# Patient Record
Sex: Female | Born: 1945 | Race: White | Hispanic: No | Smoking: Never smoker
Health system: Southern US, Community
[De-identification: ages and names within clinical notes are randomized; demographics above are authoritative.]

## PROBLEM LIST (undated history)

## (undated) DIAGNOSIS — N6019 Diffuse cystic mastopathy of unspecified breast: Secondary | ICD-10-CM

## (undated) DIAGNOSIS — F0280 Dementia in other diseases classified elsewhere without behavioral disturbance: Secondary | ICD-10-CM

## (undated) DIAGNOSIS — I10 Essential (primary) hypertension: Secondary | ICD-10-CM

## (undated) DIAGNOSIS — G309 Alzheimer's disease, unspecified: Secondary | ICD-10-CM

## (undated) DIAGNOSIS — F0390 Unspecified dementia without behavioral disturbance: Secondary | ICD-10-CM

## (undated) HISTORY — DX: Unspecified dementia, unspecified severity, without behavioral disturbance, psychotic disturbance, mood disturbance, and anxiety: F03.90

## (undated) HISTORY — DX: Essential (primary) hypertension: I10

## (undated) HISTORY — PX: TUBAL LIGATION: SHX77

## (undated) HISTORY — PX: NASAL SINUS SURGERY: SHX719

## (undated) HISTORY — DX: Dementia in other diseases classified elsewhere, unspecified severity, without behavioral disturbance, psychotic disturbance, mood disturbance, and anxiety: F02.80

## (undated) HISTORY — PX: DILATION AND CURETTAGE OF UTERUS: SHX78

## (undated) HISTORY — DX: Diffuse cystic mastopathy of unspecified breast: N60.19

## (undated) HISTORY — DX: Alzheimer's disease, unspecified: G30.9

## (undated) HISTORY — PX: TONSILLECTOMY AND ADENOIDECTOMY: SUR1326

## (undated) HISTORY — PX: COLONOSCOPY: SHX174

---

## 2004-08-03 ENCOUNTER — Ambulatory Visit: Payer: Self-pay | Admitting: General Surgery

## 2004-08-15 ENCOUNTER — Ambulatory Visit: Payer: Self-pay | Admitting: General Surgery

## 2005-09-04 ENCOUNTER — Ambulatory Visit: Payer: Self-pay | Admitting: General Surgery

## 2006-09-20 ENCOUNTER — Ambulatory Visit: Payer: Self-pay | Admitting: Gastroenterology

## 2006-10-07 ENCOUNTER — Ambulatory Visit: Payer: Self-pay | Admitting: Gastroenterology

## 2006-10-22 ENCOUNTER — Ambulatory Visit: Payer: Self-pay | Admitting: General Surgery

## 2007-02-04 ENCOUNTER — Ambulatory Visit: Payer: Self-pay | Admitting: Internal Medicine

## 2007-12-04 ENCOUNTER — Ambulatory Visit: Payer: Self-pay | Admitting: Internal Medicine

## 2008-12-06 ENCOUNTER — Ambulatory Visit: Payer: Self-pay | Admitting: General Surgery

## 2009-12-14 ENCOUNTER — Ambulatory Visit: Payer: Self-pay

## 2010-12-20 ENCOUNTER — Ambulatory Visit: Payer: Self-pay | Admitting: General Surgery

## 2011-12-24 ENCOUNTER — Ambulatory Visit: Payer: Self-pay | Admitting: General Surgery

## 2012-06-25 ENCOUNTER — Encounter: Payer: Self-pay | Admitting: *Deleted

## 2013-01-05 ENCOUNTER — Ambulatory Visit: Payer: Self-pay | Admitting: General Surgery

## 2013-03-20 ENCOUNTER — Observation Stay: Payer: Self-pay | Admitting: Internal Medicine

## 2013-03-20 LAB — CBC
HCT: 39.5 % (ref 35.0–47.0)
HGB: 13 g/dL (ref 12.0–16.0)
MCH: 30.2 pg (ref 26.0–34.0)
MCHC: 33 g/dL (ref 32.0–36.0)
MCV: 92 fL (ref 80–100)
Platelet: 172 10*3/uL (ref 150–440)
RBC: 4.31 10*6/uL (ref 3.80–5.20)
RDW: 13.2 % (ref 11.5–14.5)
WBC: 11.9 10*3/uL — AB (ref 3.6–11.0)

## 2013-03-20 LAB — URINALYSIS, COMPLETE
Bilirubin,UR: NEGATIVE
Blood: NEGATIVE
GLUCOSE, UR: NEGATIVE mg/dL (ref 0–75)
Hyaline Cast: 5
Ketone: NEGATIVE
Nitrite: NEGATIVE
PH: 8 (ref 4.5–8.0)
Protein: 30
RBC,UR: 3 /HPF (ref 0–5)
SPECIFIC GRAVITY: 1.013 (ref 1.003–1.030)
Squamous Epithelial: 1
WBC UR: 97 /HPF (ref 0–5)

## 2013-03-20 LAB — COMPREHENSIVE METABOLIC PANEL
ANION GAP: 5 — AB (ref 7–16)
Albumin: 3.9 g/dL (ref 3.4–5.0)
Alkaline Phosphatase: 43 U/L — ABNORMAL LOW
BILIRUBIN TOTAL: 0.3 mg/dL (ref 0.2–1.0)
BUN: 14 mg/dL (ref 7–18)
Calcium, Total: 9.1 mg/dL (ref 8.5–10.1)
Chloride: 105 mmol/L (ref 98–107)
Co2: 29 mmol/L (ref 21–32)
Creatinine: 1.39 mg/dL — ABNORMAL HIGH (ref 0.60–1.30)
EGFR (African American): 45 — ABNORMAL LOW
EGFR (Non-African Amer.): 39 — ABNORMAL LOW
Glucose: 121 mg/dL — ABNORMAL HIGH (ref 65–99)
Osmolality: 279 (ref 275–301)
Potassium: 3.8 mmol/L (ref 3.5–5.1)
SGOT(AST): 27 U/L (ref 15–37)
SGPT (ALT): 29 U/L (ref 12–78)
Sodium: 139 mmol/L (ref 136–145)
Total Protein: 6.8 g/dL (ref 6.4–8.2)

## 2013-03-20 LAB — TROPONIN I: Troponin-I: <0.02 ng/mL

## 2013-03-22 LAB — CBC WITH DIFFERENTIAL/PLATELET
BASOS ABS: 0.1 10*3/uL (ref 0.0–0.1)
BASOS PCT: 0.9 %
Eosinophil #: 0.2 10*3/uL (ref 0.0–0.7)
Eosinophil %: 3.7 %
HCT: 35.8 % (ref 35.0–47.0)
HGB: 12 g/dL (ref 12.0–16.0)
LYMPHS ABS: 1.8 10*3/uL (ref 1.0–3.6)
LYMPHS PCT: 26.3 %
MCH: 30.7 pg (ref 26.0–34.0)
MCHC: 33.6 g/dL (ref 32.0–36.0)
MCV: 91 fL (ref 80–100)
Monocyte #: 0.6 x10 3/mm (ref 0.2–0.9)
Monocyte %: 9.1 %
Neutrophil #: 4 10*3/uL (ref 1.4–6.5)
Neutrophil %: 60 %
PLATELETS: 181 10*3/uL (ref 150–440)
RBC: 3.93 10*6/uL (ref 3.80–5.20)
RDW: 12.9 % (ref 11.5–14.5)
WBC: 6.7 10*3/uL (ref 3.6–11.0)

## 2013-03-22 LAB — BASIC METABOLIC PANEL
Anion Gap: 5 — ABNORMAL LOW (ref 7–16)
BUN: 9 mg/dL (ref 7–18)
CREATININE: 1.31 mg/dL — AB (ref 0.60–1.30)
Calcium, Total: 8.9 mg/dL (ref 8.5–10.1)
Chloride: 104 mmol/L (ref 98–107)
Co2: 30 mmol/L (ref 21–32)
EGFR (Non-African Amer.): 42 — ABNORMAL LOW
GFR CALC AF AMER: 49 — AB
Glucose: 101 mg/dL — ABNORMAL HIGH (ref 65–99)
OSMOLALITY: 276 (ref 275–301)
POTASSIUM: 2.9 mmol/L — AB (ref 3.5–5.1)
Sodium: 139 mmol/L (ref 136–145)

## 2013-03-22 LAB — URINE CULTURE

## 2013-03-23 ENCOUNTER — Ambulatory Visit: Payer: Self-pay | Admitting: Internal Medicine

## 2013-03-23 LAB — BASIC METABOLIC PANEL
Anion Gap: 4 — ABNORMAL LOW (ref 7–16)
BUN: 12 mg/dL (ref 7–18)
CALCIUM: 8.9 mg/dL (ref 8.5–10.1)
CHLORIDE: 105 mmol/L (ref 98–107)
Co2: 30 mmol/L (ref 21–32)
Creatinine: 1.28 mg/dL (ref 0.60–1.30)
EGFR (Non-African Amer.): 43 — ABNORMAL LOW
GFR CALC AF AMER: 50 — AB
Glucose: 88 mg/dL (ref 65–99)
OSMOLALITY: 277 (ref 275–301)
Potassium: 3.1 mmol/L — ABNORMAL LOW (ref 3.5–5.1)
SODIUM: 139 mmol/L (ref 136–145)

## 2013-03-23 LAB — CBC WITH DIFFERENTIAL/PLATELET
BASOS PCT: 1.2 %
Basophil #: 0.1 10*3/uL (ref 0.0–0.1)
EOS PCT: 4.4 %
Eosinophil #: 0.3 10*3/uL (ref 0.0–0.7)
HCT: 37.3 % (ref 35.0–47.0)
HGB: 12.3 g/dL (ref 12.0–16.0)
LYMPHS ABS: 1.7 10*3/uL (ref 1.0–3.6)
Lymphocyte %: 23.9 %
MCH: 30 pg (ref 26.0–34.0)
MCHC: 32.9 g/dL (ref 32.0–36.0)
MCV: 91 fL (ref 80–100)
MONOS PCT: 9.8 %
Monocyte #: 0.7 x10 3/mm (ref 0.2–0.9)
NEUTROS ABS: 4.3 10*3/uL (ref 1.4–6.5)
Neutrophil %: 60.7 %
PLATELETS: 187 10*3/uL (ref 150–440)
RBC: 4.1 10*6/uL (ref 3.80–5.20)
RDW: 12.7 % (ref 11.5–14.5)
WBC: 7.2 10*3/uL (ref 3.6–11.0)

## 2013-11-02 ENCOUNTER — Encounter: Payer: Self-pay | Admitting: *Deleted

## 2014-03-28 ENCOUNTER — Emergency Department: Payer: Self-pay | Admitting: Internal Medicine

## 2014-03-28 LAB — CBC
HCT: 36 % (ref 35.0–47.0)
HGB: 11.6 g/dL — AB (ref 12.0–16.0)
MCH: 29.6 pg (ref 26.0–34.0)
MCHC: 32.2 g/dL (ref 32.0–36.0)
MCV: 92 fL (ref 80–100)
Platelet: 176 10*3/uL (ref 150–440)
RBC: 3.92 10*6/uL (ref 3.80–5.20)
RDW: 12.2 % (ref 11.5–14.5)
WBC: 13.4 10*3/uL — ABNORMAL HIGH (ref 3.6–11.0)

## 2014-03-28 LAB — COMPREHENSIVE METABOLIC PANEL
ALK PHOS: 38 U/L
AST: 20 U/L
Albumin: 4 g/dL
Anion Gap: 5 — ABNORMAL LOW (ref 7–16)
BILIRUBIN TOTAL: 0.6 mg/dL
BUN: 23 mg/dL — AB
CREATININE: 1.37 mg/dL — AB
Calcium, Total: 9 mg/dL
Chloride: 104 mmol/L
Co2: 31 mmol/L
EGFR (African American): 46 — ABNORMAL LOW
EGFR (Non-African Amer.): 40 — ABNORMAL LOW
GLUCOSE: 108 mg/dL — AB
Potassium: 3.7 mmol/L
SGPT (ALT): 18 U/L
Sodium: 140 mmol/L
TOTAL PROTEIN: 6.7 g/dL

## 2014-03-28 LAB — TROPONIN I: Troponin-I: 0.03 ng/mL

## 2014-03-28 LAB — URINALYSIS, COMPLETE
BILIRUBIN, UR: NEGATIVE
BLOOD: NEGATIVE
Bacteria: NONE SEEN
Glucose,UR: NEGATIVE mg/dL (ref 0–75)
Ketone: NEGATIVE
Nitrite: NEGATIVE
PH: 6 (ref 4.5–8.0)
RBC,UR: NONE SEEN /HPF (ref 0–5)
Specific Gravity: 1.011 (ref 1.003–1.030)
Squamous Epithelial: 2
WBC UR: 4 /HPF (ref 0–5)

## 2014-03-30 LAB — URINE CULTURE

## 2014-04-24 NOTE — H&P (Signed)
PATIENT NAME:  Dante GangLANE, Demiana F MR#:  [id number] DATE OF BIRTH:  Oct 25, 1945  DATE OF ADMISSION:  03/20/2013  PRIMARY CARE PHYSICIAN: Dr. Amado NashJ.B. Lourdes SledgeWalker, [facility].   CHIEF COMPLAINT: Syncopal spell today and a fall yesterday.   HISTORY OF PRESENT ILLNESS: Ms. Maurice MarchLane is a pleasant 69 year old Caucasian female with past medical history of advanced dementia, who is totally dependent for care, who is cared upon totally by her husband. The patient's dementia per family started in 2009, however, in the last year or year and a half is progressing very rapidly to the point patient is dependent on her husband for everything. All her care is given by her husband and aide who helps him out a few times a week. Per husband,  the patient was sitting at the breakfast bar yesterday. She tried to get up, but the patient fell down on the floor and has a bruise on her left eyebrow. There was minimal bleeding noted at that time. The patient did well all day yesterday; however, this morning she was in the shower, being helped by a caregiver. She felt very flushed, diaphoretic, and sat down on the side of the bathtub. She then sat on the commode and had a large BM per husband and thereafter felt very lightheaded and had a syncopal episode, where daughter came in to help out the caregiver. They put the patient down on the floor after they spoke with EMS to make sure she has good respirations. Thereafter, she was brought into the Emergency Room and was found to have blood pressure of 94/63 with pulse of 60. The patient is nonverbal, talks very little. She does have some tremors on and off. In the Emergency Room, she received IV fluids. Her blood pressure is stable at present, 134/70. She was found to have UTI. She is going to be started on IV Cipro. CT of the head is unremarkable. She remains in sinus rhythm. She is going to be admitted for further evaluation and management.   ALLERGIES: No known drug allergies.    MEDICATIONS: 1.  Simvastatin 40 mg daily.  2.  Risperidone 2 mg tablet, 1-1/2 tablets (that is 3 mg) once a day, which the 1/2 tablet was increased by husband recently which helps her relax, and 2 mg at bedtime.  3.  Losartan 100 mg daily.  4.  Aspirin 81 mg daily.   PAST MEDICAL HISTORY: 1.  Hypertension.  2.  CKD, stage II. Creatinine baseline around 1.3 to 1.4.  3.  Hyperlipidemia.  4.  Dementia with vision and memory loss, progressively getting worse.  5.  Obesity. 6.  Alzheimer's dementia since 2009.   PAST SURGICAL HISTORY:  1.  Bilateral carpal tunnel repair in 1997.  2.  D and C. 3.  Tubal ligation.   FAMILY HISTORY: Positive for breast cancer. This is obtained from old records. Positive for hypertension, diabetes, and heart disease.   SOCIAL HISTORY: Lives with her husband, who gives her 24/7 care. The patient does not smoke or drink.   REVIEW OF SYSTEMS: Unable to obtain since the patient has advanced dementia. She is not able to answer most of my questions. However, most of it is covered in present illness as related by the family.   PHYSICAL EXAMINATION: GENERAL: The patient is awake. She keeps her eyes closed. She opens eyes on verbal command.  VITAL SIGNS: Afebrile. Pulse is 74. Blood pressure is 151/71. Sats are 95% on room air.  HEENT: The patient has mild  bruise over the left eyebrow. No bleeding. Rest atraumatic. Pupils: PERRLA. EOM intact. Oral mucosa is moist.  NECK: Supple. No JVD. No carotid bruit.  LUNGS: Clear to auscultation bilaterally. No rales, rhonchi, respiratory distress or labored breathing.  HEART: Both the heart sounds are normal. Rate, rhythm regular. PMI not lateralized. Chest is nontender.  EXTREMITIES: Good pedal pulses. Good femoral pulses. No lower extremity edema.  ABDOMEN: Soft, benign, nontender. No organomegaly. Positive bowel sounds.  NEUROLOGIC: Gross intact cranial nerves II through XII. No motor or sensory deficit. Neuro exam again  limited secondary to patient's advanced dementia, is grossly nonfocal. Gait not tested.  SKIN: Warm and dry.  PSYCHIATRIC: The patient is awake. She is alert. However, she is nonverbal, likely secondary to progressive dementia.   LABORATORY, DIAGNOSTIC, AND RADIOLOGICAL DATA: EKG shows normal sinus rhythm, nonspecific ST-T abnormality. Urinalysis positive for UTI. Troponin less than 0.02. White count is 11.9; hemoglobin and hematocrit are 13 and 39.5; platelet count is 172. Glucose is 121; BUN is 14, creatinine 1.39; sodium is 139; potassium is 3.8. LFTs within normal limits. CT of the head: No acute intracranial abnormality. Chest x-ray: No active cardiopulmonary disease.   ASSESSMENT: A 69 year old Jax Abdelrahman with history of advanced progressive dementia, hypertension, and hyperlipidemia, comes in with:  1.  Syncopal episode today with a fall yesterday: Etiology unclear. Could be secondary to vasovagal symptoms after patient had a large bowel movement today while she was on the commode in the bathroom and precipitated by urinary tract infection, which patient was found to have on urinalysis. The patient was hypotensive initially in the Emergency Room, received IV fluids. Blood pressure is now stable. We will admit her to off-unit telemetry. Give IV fluids, another liter, and start her on IV Cipro 400 b.i.d. for urinary tract infection. Will monitor blood pressure and tele rhythm.  2.  Urinary tract infection: Continue IV Cipro. Follow up urine cultures. Change to p.o. Cipro at discharge.  3.  History of hypertension with relative hypotension on arrival: Will hold off on losartan for now. Monitor blood pressure. If it continues to remain stable, resume it.  4.  Hyperlipidemia: On simvastatin.  5.  Advanced progressive dementia: Continue Risperdal.  6.  I discussed the admission plan with the patient's daughter and husband, who were present in the Emergency Room. Discussed about the role of physical  therapy; however, since the patient has advanced progressive dementia, not sure how much she will be following up with physical therapy instructions, hence, will hold off on physical therapy, which family is agreeable. Family also does not wish to pursue any further work-up with MRI or any other aggressive testing at this time. Code status discussed. The patient is a full code.   TIME SPENT: 55 minutes.    ____________________________ Wylie Hail Allena Katz, MD sap:jcm D: 03/20/2013 16:08:02 ET T: 03/20/2013 18:18:01 ET JOB#: [id number]  cc: Dulse Rutan A. Allena Katz, MD, <Dictator> John B. Danne Harbor, MD Willow Ora MD ELECTRONICALLY SIGNED 04/09/2013 16:21

## 2014-04-24 NOTE — Discharge Summary (Signed)
PATIENT NAME:  Susan Davenport, Susan Davenport MR#:  [id number] DATE OF BIRTH:  May 16, 1945  DATE OF ADMISSION:  03/20/2013 DATE OF DISCHARGE:  03/24/2013  HISTORY OF PRESENT ILLNESS: Susan Davenport is a 69 year old white lady with a history of end-stage dementia who was brought to the Emergency Room after she fell to the floor and could not get back up. The patient is essentially a total care patient who had stayed at home by being cared for by her husband. In the Emergency Room, she was flushed and diaphoretic, and had had a syncopal episode and was brought to the ER.   ALLERGIES: No known drug allergies.   MEDICATIONS: Prior to admission include: Simvastatin 40 mg daily, risperidone 2 mg tablets 1-1/2 tablets once a day in the morning and 2 mg at bedtime, losartan 100 mg daily and aspirin 81 mg daily.   PAST MEDICAL HISTORY:  Notable for hypertension, stage 2 chronic kidney disease, hyperlipidemia, severe dementia and obesity.   ADMISSION PHYSICAL EXAMINATION: Revealed pulse 74, blood pressure 151/71 and a pulse ox of 95% on room air. The patient was noted to have a small bruise over her left eyebrow. There was no bleeding. The remainder of the examination was relatively unremarkable. Neurological could not be ascertained due to the patient's severe dementia. She was noted to be alert but nonverbal.   LABORATORY, DIAGNOSTIC AND RADIOLOGICAL DATA:  EKG showed a normal sinus rhythm with nonspecific ST-T wave changes. Urinalysis showed positive sediment. Troponin was 0.02. White count was 11,900, hemoglobin and hematocrit were 13 and 39.5 respectively, platelet count 172,000. Glucose was 121, BUN was 14, creatinine was 1.39, sodium was 139, potassium was 3.8. Liver function studies were within normal limits. Unenhanced T of the head done in the ER showed no acute changes. Chest x-ray showed no acute cardiopulmonary disease.   HOSPITAL COURSE: The patient was admitted to the regular medical floor where she was rehydrated  with IV fluids as she was initially hypotensive in the Emergency Room. Cultures were obtained. The patient was started on some IV Cipro for suspected urinary tract infection. The patient's urinalysis eventually grew out greater than 100,000 colonies of Proteus that was sensitive to Cipro. The patient was eventually converted to oral Cipro. She was seen by PT but was not felt to be a candidate for rehab. She was then seen by palliative care to arrange for her to go home with  hospice care.   DISCHARGE DIAGNOSES: 1.  Metabolic encephalopathy due to Proteus urinary tract infection.  2.  End-stage dementia of Alzheimer's type.  3.  History of hypertension.   DISCHARGE MEDICATIONS: 1.  Aspirin 81 mg daily.  2.  Losartan 100 mg daily.  3.  Risperdal 3 mg b.i.d.  4.  Zoloft 100 mg daily.  5.  Cipro 500 mg b.i.d. for an additional 5 days.  6.  The patient was going to do discontinue her simvastatin and potassium chloride.   DISCHARGE INSTRUCTIONS:  The patient was discharged on a regular diet with activity as tolerated. As noted, she is to be followed by hospice at home. The patient already had an appointment for April which she is to keep in followup.   ____________________________ Letta PateJohn B. Danne HarborWalker III, MD jbw:cs D: 04/07/2013 19:42:44 ET T: 04/07/2013 20:06:20 ET JOB#: [id number]  cc: Letta PateJohn B. Danne HarborWalker III, MD, <Dictator> Elmo PuttJOHN B WALKER III MD ELECTRONICALLY SIGNED 04/09/2013 7:29

## 2014-04-24 NOTE — Consult Note (Signed)
   Comments   I spoke with pt's husband and daughters. They have discussed her code status and have made a decision for DNR. Order entered. Will complete a portable form.   Electronic Signatures for Addendum Section:  Phifer, Harriett SineNancy (MD) (Signed Addendum 24-Mar-15 14:20)  Discussed with Elouise MunroeJosh Borders, NP, in detail. Agree with assessment and plan as outlined in above note. Familyl has decided on DNR. Out-of-facility DNR completed and placed on chart.   Electronic Signatures: Borders, Daryl EasternJoshua R (NP)  (Signed 24-Mar-15 10:57)  Authored: Palliative Care   Last Updated: 24-Mar-15 14:20 by Phifer, Harriett SineNancy (MD)

## 2015-12-28 ENCOUNTER — Encounter: Payer: Self-pay | Admitting: Emergency Medicine

## 2015-12-28 ENCOUNTER — Emergency Department: Payer: Medicare Other

## 2015-12-28 ENCOUNTER — Emergency Department
Admission: EM | Admit: 2015-12-28 | Discharge: 2015-12-28 | Disposition: A | Discharge status: Home / Self Care | Payer: Medicare Other | Attending: Emergency Medicine | Admitting: Emergency Medicine

## 2015-12-28 DIAGNOSIS — I10 Essential (primary) hypertension: Secondary | ICD-10-CM | POA: Diagnosis not present

## 2015-12-28 DIAGNOSIS — Y999 Unspecified external cause status: Secondary | ICD-10-CM | POA: Insufficient documentation

## 2015-12-28 DIAGNOSIS — S0101XA Laceration without foreign body of scalp, initial encounter: Secondary | ICD-10-CM

## 2015-12-28 DIAGNOSIS — W050XXA Fall from non-moving wheelchair, initial encounter: Secondary | ICD-10-CM | POA: Diagnosis not present

## 2015-12-28 DIAGNOSIS — Y929 Unspecified place or not applicable: Secondary | ICD-10-CM | POA: Diagnosis not present

## 2015-12-28 DIAGNOSIS — Z79899 Other long term (current) drug therapy: Secondary | ICD-10-CM | POA: Diagnosis not present

## 2015-12-28 DIAGNOSIS — G309 Alzheimer's disease, unspecified: Secondary | ICD-10-CM | POA: Diagnosis not present

## 2015-12-28 DIAGNOSIS — Y939 Activity, unspecified: Secondary | ICD-10-CM | POA: Diagnosis not present

## 2015-12-28 DIAGNOSIS — S0990XA Unspecified injury of head, initial encounter: Secondary | ICD-10-CM

## 2015-12-28 NOTE — ED Provider Notes (Signed)
[facility] Emergency Department Provider Note    L5 caveat: Review of systems and history is limited by dementia    Time seen: ----------------------------------------- 10:21 PM on 12/28/2015 -----------------------------------------    I have reviewed the triage vital signs and the nursing notes.   HISTORY  Chief Complaint Fall and Laceration    HPI Susan Davenport is a 70 y.o. female who presents to ER after a fall with a forehead laceration. Patient fell out of her wheelchair tonight causing her to hit her head on the floor. She is currently wheelchair-bound and has severe dementia. She was noted to have a laceration to her forehead. She denied any complaint to her husband.   Past Medical History:  Diagnosis Date  . Alzheimer's disease   . Dementia   . Diffuse cystic mastopathy   . Hypertension     There are no active problems to display for this patient.   Past Surgical History:  Procedure Laterality Date  . COLONOSCOPY    . DILATION AND CURETTAGE OF UTERUS    . NASAL SINUS SURGERY    . TONSILLECTOMY AND ADENOIDECTOMY    . TUBAL LIGATION      Allergies Patient has no known allergies.  Social History Social History  Substance Use Topics  . Smoking status: Never Smoker  . Smokeless tobacco: Never Used  . Alcohol use No    Review of Systems Unknown, positive for laceration and head injury  ____________________________________________   PHYSICAL EXAM:  VITAL SIGNS: ED Triage Vitals  Enc Vitals Group     BP 12/28/15 2107 136/76     Pulse Rate 12/28/15 2107 (!) 58     Resp 12/28/15 2107 18     Temp 12/28/15 2107 98.3 F (36.8 C)     Temp Source 12/28/15 2107 Oral     SpO2 12/28/15 2107 97 %     Weight 12/28/15 2109 100 lb (45.4 kg)     Height 12/28/15 2109 5' (1.524 m)     Head Circumference --      Peak Flow --      Pain Score --      Pain Loc --      Pain Edu? --      Excl. in GC? --     Constitutional: No  distress, patient does not respond to commands Eyes: Conjunctivae are normal. Normal extraocular movements. ENT   Head: Normocephalic with 3 vertical lacerations to the right of midline on the frontal scalp. There are 2 cm, 3 cm and 1 cm respectively   Nose: No congestion/rhinnorhea.   Mouth/Throat: Mucous membranes are moist.   Neck: No stridor. Cardiovascular: Normal rate, regular rhythm. No murmurs, rubs, or gallops. Respiratory: Normal respiratory effort without tachypnea nor retractions. Breath sounds are clear and equal bilaterally. No wheezes/rales/rhonchi. Musculoskeletal: Limited range of motion without focal tenderness Neurologic:  Patient cannot cooperate with neurologic exam Skin:  Skin is warm, dry and intact. Frontal scalp lacerations as dictated above ____________________________________________  ED COURSE:  Pertinent labs & imaging results that were available during my care of the patient were reviewed by me and considered in my medical decision making (see chart for details). Clinical Course   Patient presents to the ER after fall and head injury. She will require laceration repair and imaging.  Marland Kitchen..Laceration Repair Date/Time: 12/28/2015 10:23 PM Performed by: Emily FilbertWILLIAMS, Quintarius Ferns E Authorized by: Daryel NovemberWILLIAMS, Willona Phariss E   Consent:    Consent given by:  Spouse Anesthesia (see MAR  for exact dosages):    Anesthesia method:  None Laceration details:    Location:  Scalp   Length (cm):  6   Depth (mm):  3 Repair type:    Repair type:  Simple Treatment:    Area cleansed with:  Soap and water   Amount of cleaning:  Standard Skin repair:    Repair method:  Tissue adhesive Approximation:    Approximation:  Close   Vermilion border: well-aligned   Post-procedure details:    Patient tolerance of procedure:  Tolerated well, no immediate complications   ____________________________________________   RADIOLOGY  CT head and C-spine are  unremarkable  ____________________________________________  FINAL ASSESSMENT AND PLAN  Head injury, scalp laceration  Plan: Patient with imaging as dictated above. Patient's no acute distress, excellent wound closure. CT scan is unremarkable. She is stable for outpatient follow-up.   Emily FilbertWilliams, Chelli Yerkes E, MD   Note: This dictation was prepared with Dragon dictation. Any transcriptional errors that result from this process are unintentional    Emily FilbertJonathan E Zyana Amaro, MD 12/28/15 2225

## 2015-12-28 NOTE — ED Triage Notes (Signed)
Pt presents to ED with spouse with c/o fall and laceration to forehead after falling out of her wheelchair tonight causing her to hit her head on floor. Pt has laceration to forehead. Spouse denies LOC. Pt has hx of dementia.

## 2017-06-11 IMAGING — CT CT HEAD W/O CM
4 of 5 series · 16 of 47 positions shown, 18 images · non-contrast
Comparison: Head CT 03/28/2014

CLINICAL DATA: Fall with laceration to forehead

EXAM:
CT HEAD WITHOUT CONTRAST
CT CERVICAL SPINE WITHOUT CONTRAST
TECHNIQUE: Multidetector CT imaging of the head and cervical spine was
performed following the standard protocol without intravenous
contrast. Multiplanar CT image reconstructions of the cervical spine
were also generated.

[Series 2: head wo · axial · 0.40mm/px · z∈[+629,+734]mm · 7 of 29 slices shown, 9 images]
[im 4/29  brain]
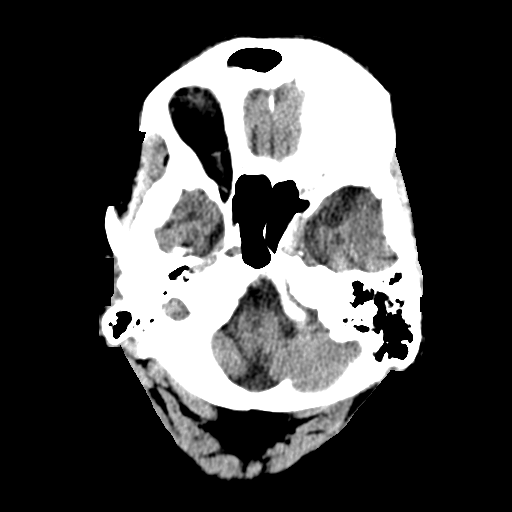
[im 4/29  bone]
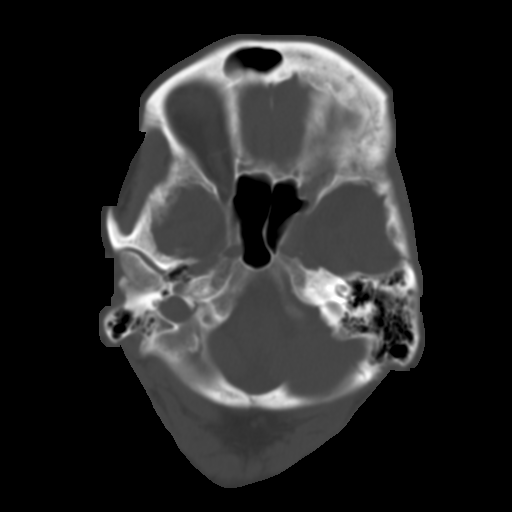
[im 8/29  brain]
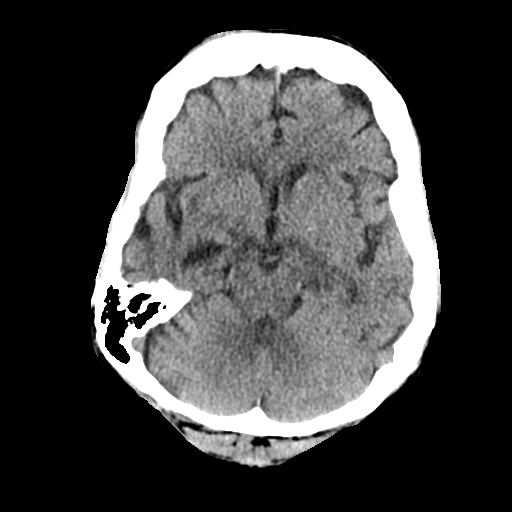
[im 11/29  brain]
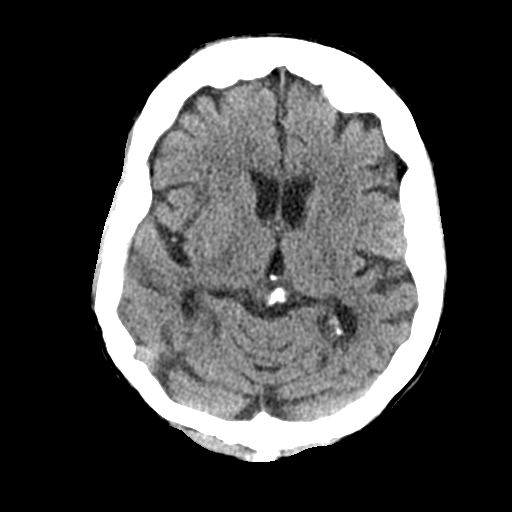
[im 15/29  brain]
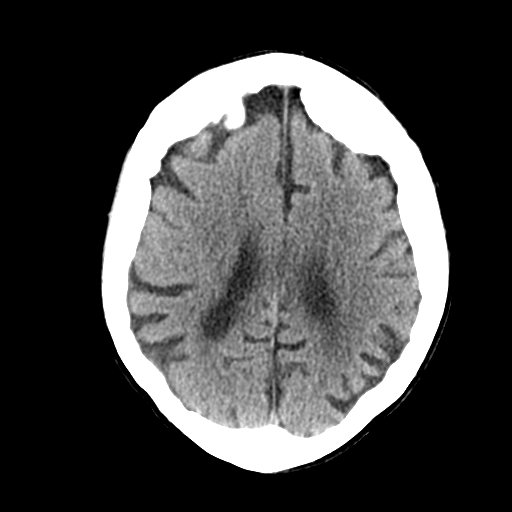
[im 18/29  brain]
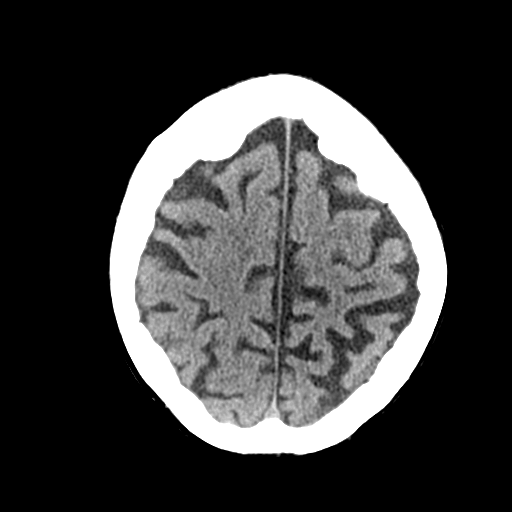
[im 18/29  bone]
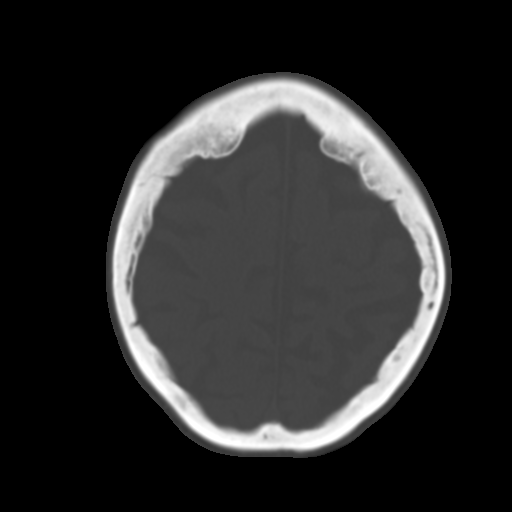
[im 22/29  brain]
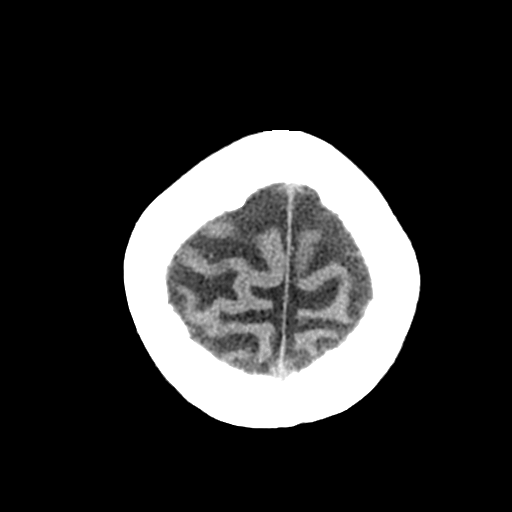
[im 25/29  brain]
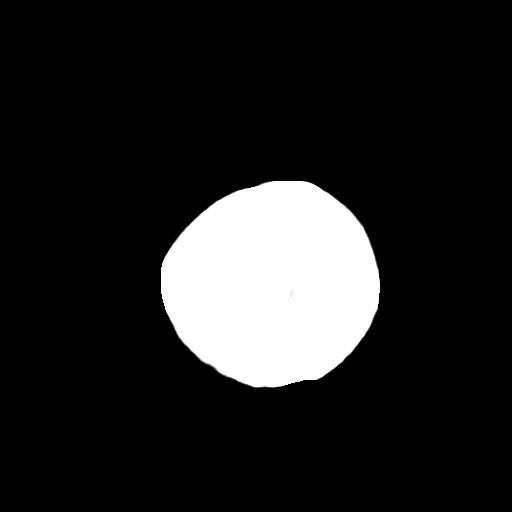

[Series 4: coronal soft tissue · coronal · 0.30mm/px · 3 of 62 slices shown]
[im 21/62  brain]
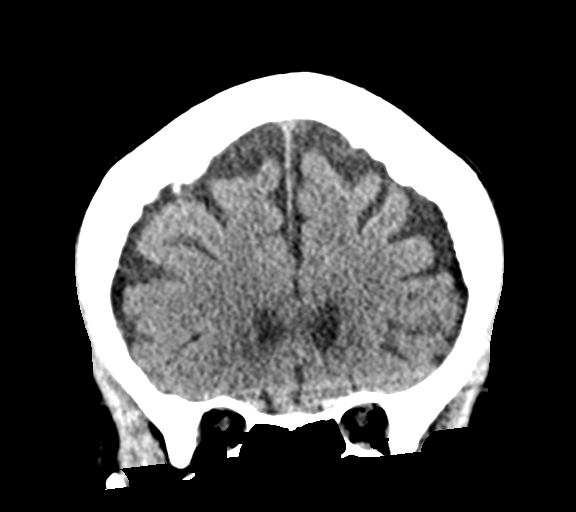
[im 28/62  brain]
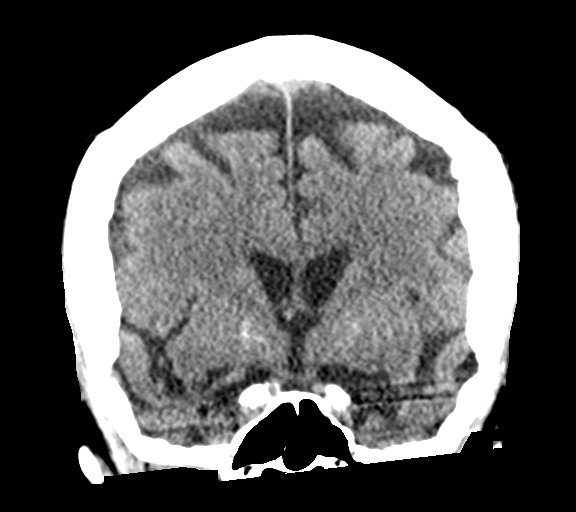
[im 34/62  brain]
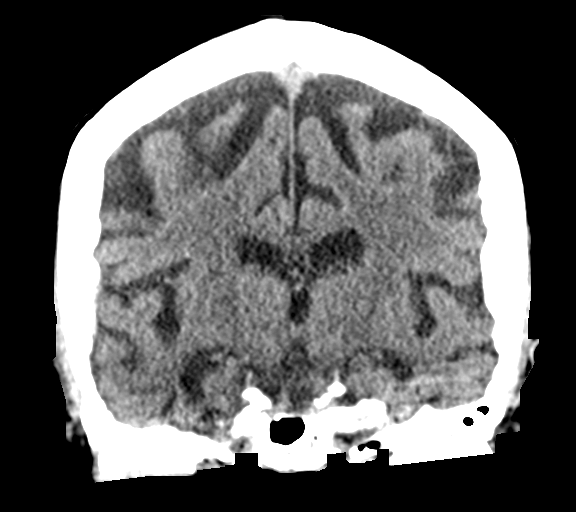

[Series 5: sagittal soft tissue · sagittal · 0.28mm/px · 1 of 48 slices shown]
[im 24/48  brain]
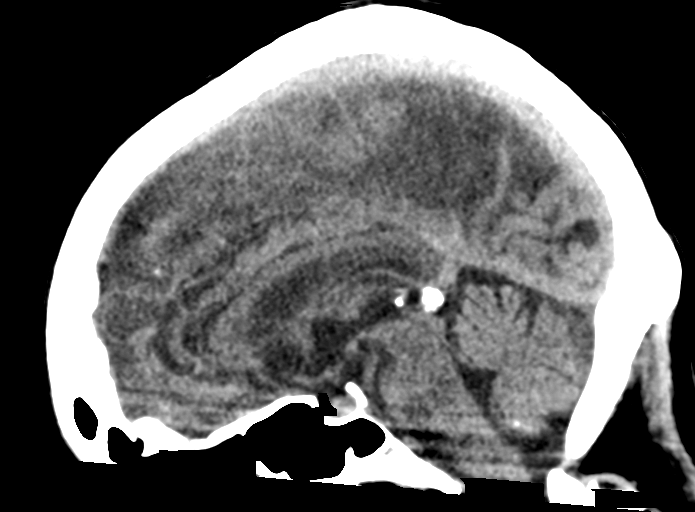

[Series 7: c spine soft · axial · 0.39mm/px · z∈[+513,+577]mm · 5 of 72 slices shown]
[im 8/72  brain]
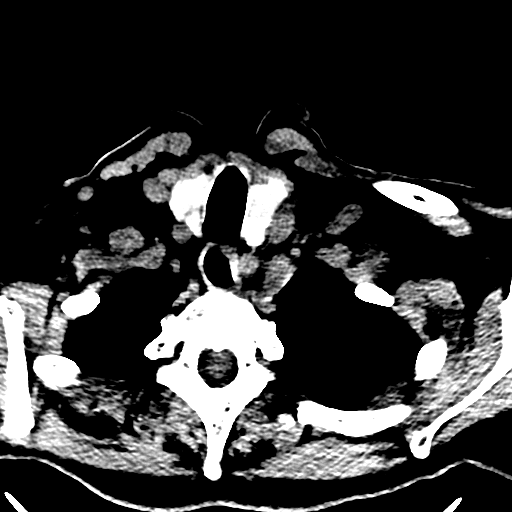
[im 15/72  brain]
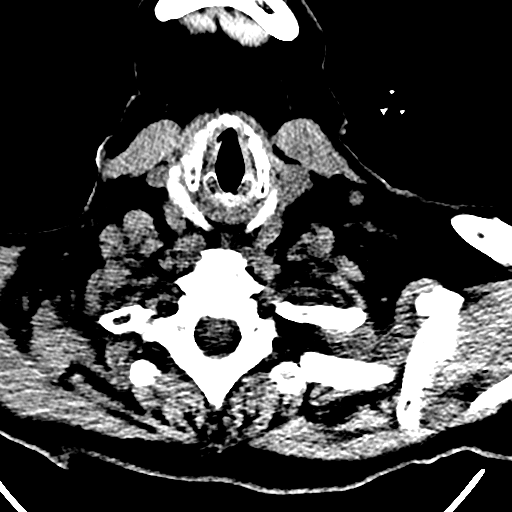
[im 22/72  brain]
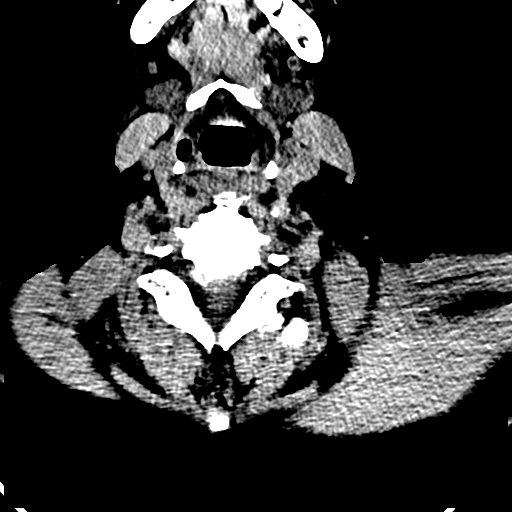
[im 32/72  brain]
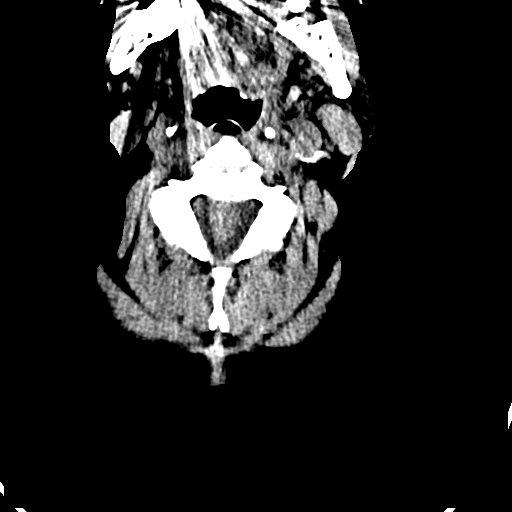
[im 40/72  brain]
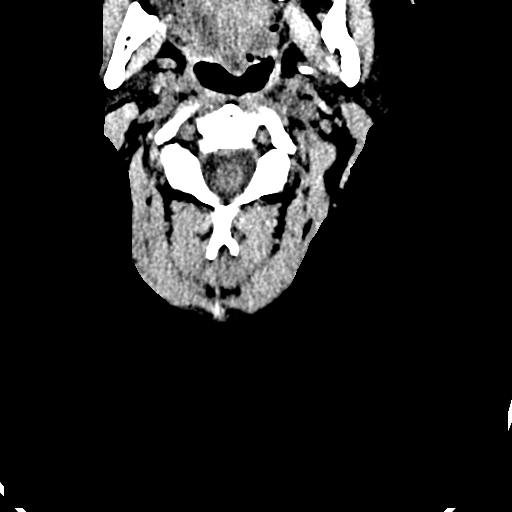

[16 of 47 positions shown; findings below may reference images not displayed]

FINDINGS: CT HEAD FINDINGS

Brain: No mass lesion, intraparenchymal hemorrhage or extra-axial
collection. No evidence of acute cortical infarct. There is
periventricular hypoattenuation compatible with chronic
microvascular disease. There is bilateral basal ganglia
mineralization.

Vascular: No hyperdense vessel or unexpected calcification.

Skull: Normal visualized skull base, calvarium and extracranial soft
tissues.

Sinuses/Orbits: No sinus fluid levels or advanced mucosal
thickening. No mastoid effusion. Normal orbits.

CT CERVICAL SPINE FINDINGS

Alignment: No static subluxation. Facets are aligned. Occipital
condyles are normally positioned.

Skull base and vertebrae: No acute fracture.

Soft tissues and spinal canal: No prevertebral fluid or swelling. No
visible canal hematoma.

Disc levels: No advanced spinal canal or neural foraminal stenosis.

Upper chest: No pneumothorax, pulmonary nodule or pleural effusion.

Other: Normal visualized paraspinal cervical soft tissues.
IMPRESSION: 1. Chronic microvascular disease without acute intracranial
abnormality.
2. No acute fracture or static subluxation of the cervical spine.

## 2018-11-02 DEATH — deceased
